# Patient Record
Sex: Male | Born: 2000 | Race: White | Hispanic: No | Marital: Single | Smoking: Never smoker
Health system: Southern US, Community
[De-identification: ages and names within clinical notes are randomized; demographics above are authoritative.]

## PROBLEM LIST (undated history)

## (undated) DIAGNOSIS — R51 Headache: Secondary | ICD-10-CM

## (undated) HISTORY — PX: CIRCUMCISION: SUR203

## (undated) HISTORY — PX: TYMPANOSTOMY TUBE PLACEMENT: SHX32

## (undated) HISTORY — DX: Headache: R51

---
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
## 2020-05-22 ENCOUNTER — Encounter (INDEPENDENT_AMBULATORY_CARE_PROVIDER_SITE_OTHER): Payer: Self-pay

## 2021-02-25 IMAGING — CT CT HEAD W/O CM
3 series · 15 of 47 positions shown, 18 images · non-contrast
Comparison: None.

CLINICAL DATA: Fall

EXAM:
CT HEAD WITHOUT CONTRAST
TECHNIQUE: Contiguous axial images were obtained from the base of the skull
through the vertex without intravenous contrast.

[Series 2: head wo · axial · 0.49mm/px · z∈[+998,+1124]mm · 9 of 31 slices shown, 12 images]
[im 3/31  brain]
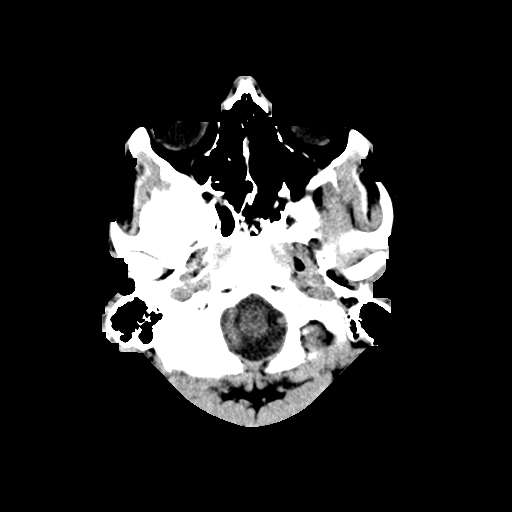
[im 3/31  bone]
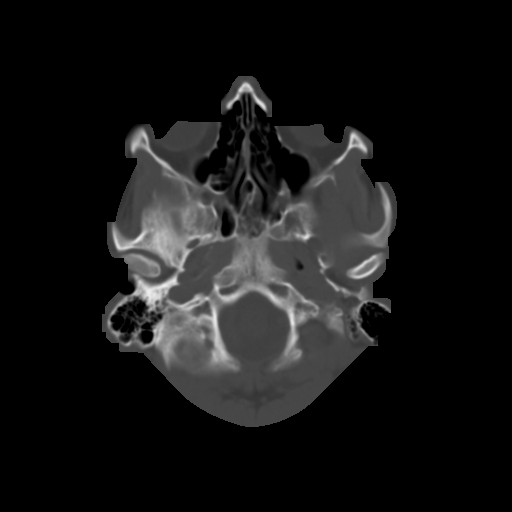
[im 6/31  brain]
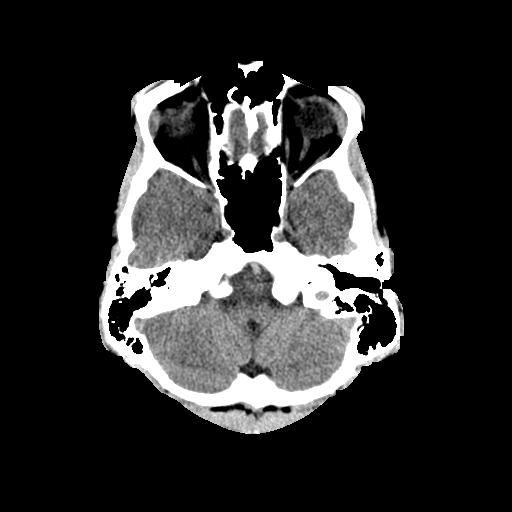
[im 9/31  brain]
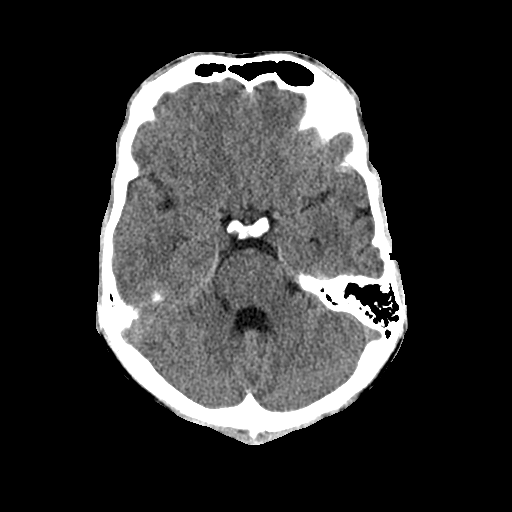
[im 12/31  brain]
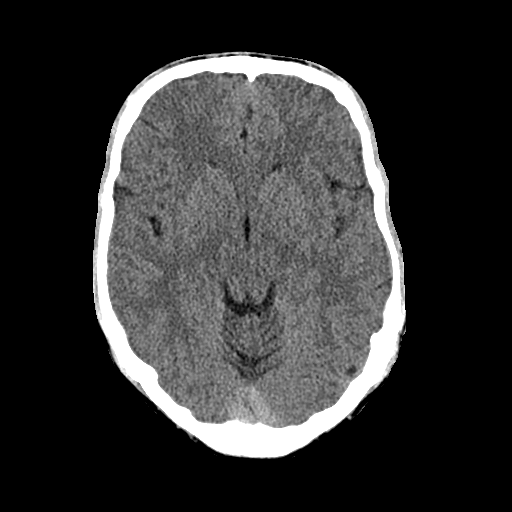
[im 16/31  brain]
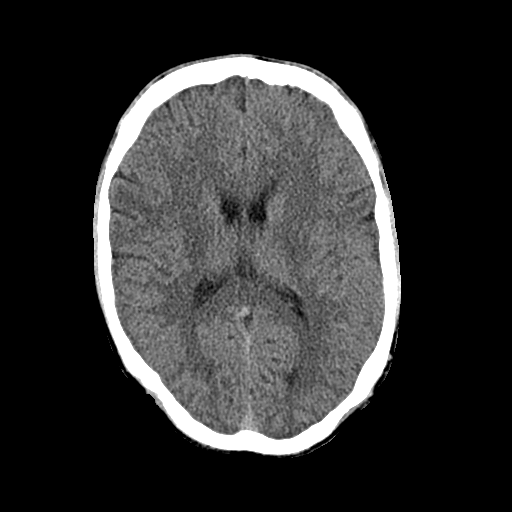
[im 16/31  bone]
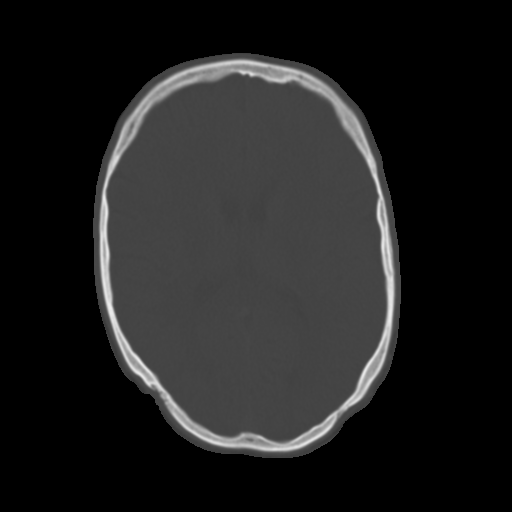
[im 19/31  brain]
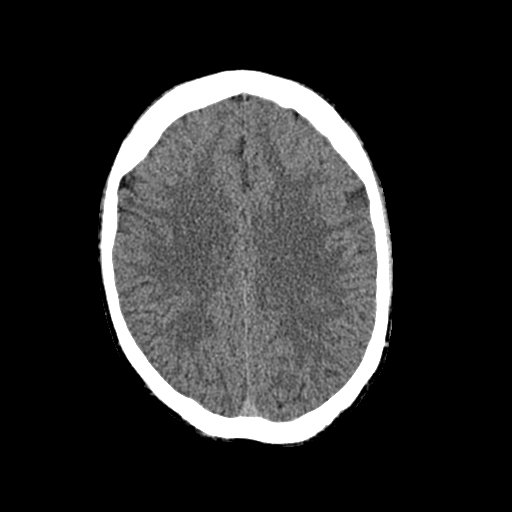
[im 22/31  brain]
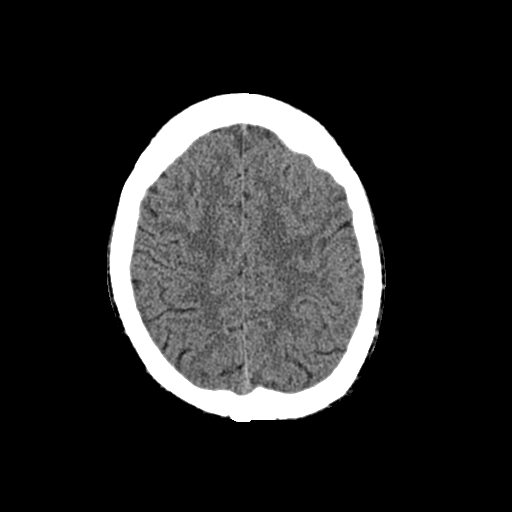
[im 25/31  brain]
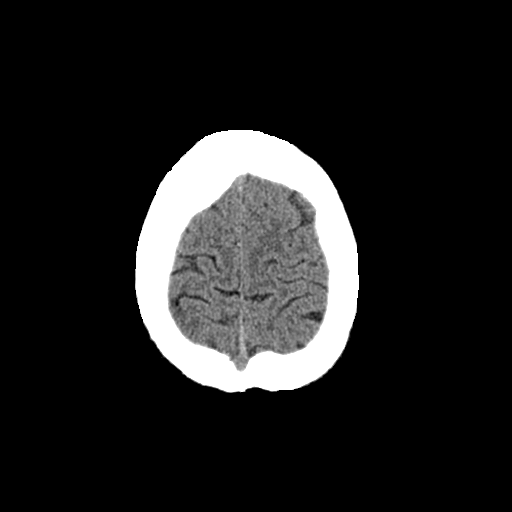
[im 28/31  brain]
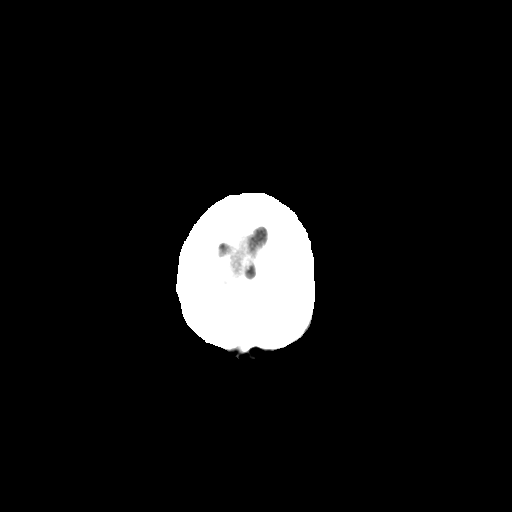
[im 28/31  bone]
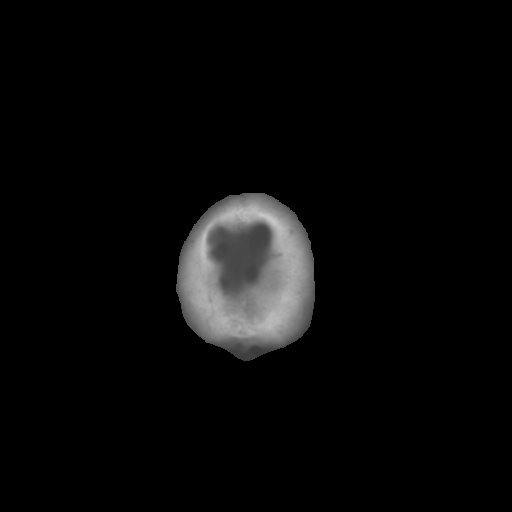

[Series 4: cor soft · coronal · 0.29mm/px · 3 of 73 slices shown]
[im 25/73  brain]
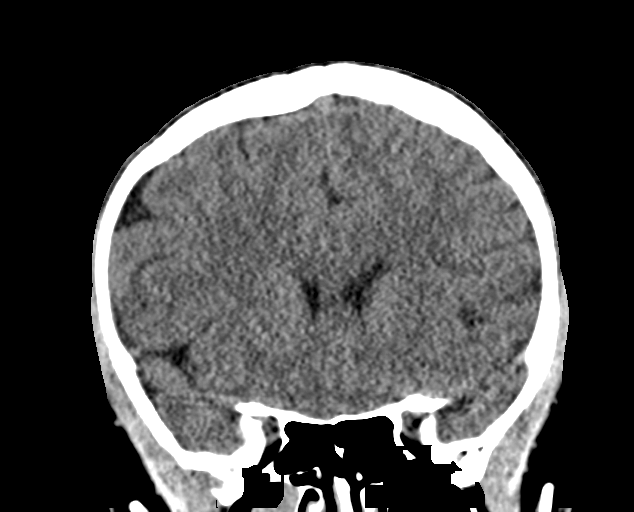
[im 33/73  brain]
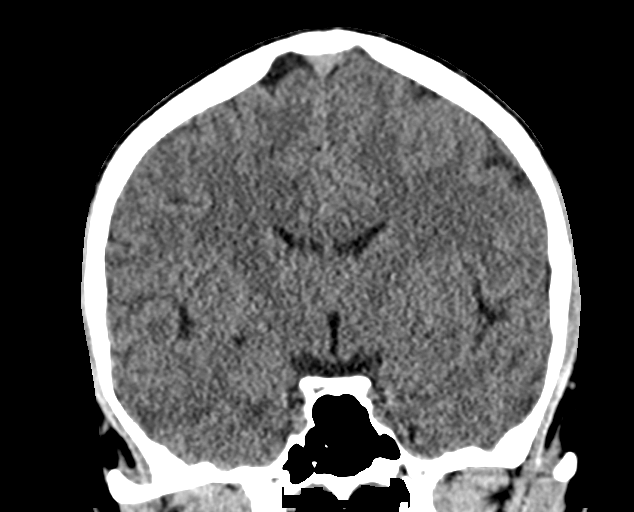
[im 41/73  brain]
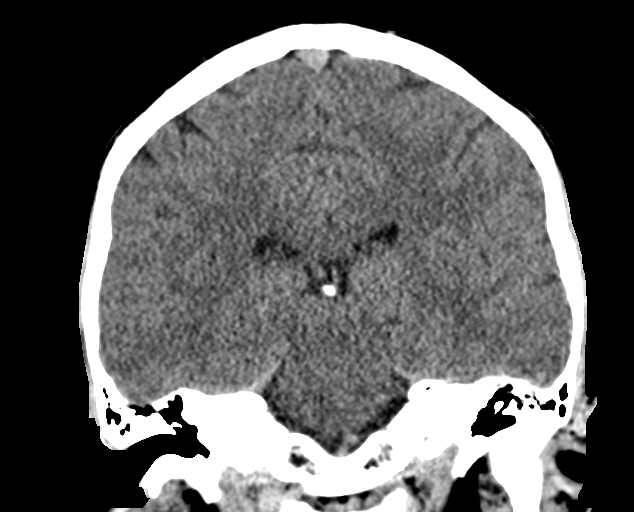

[Series 5: sag soft · sagittal · 0.29mm/px · 3 of 58 slices shown]
[im 20/58  brain]
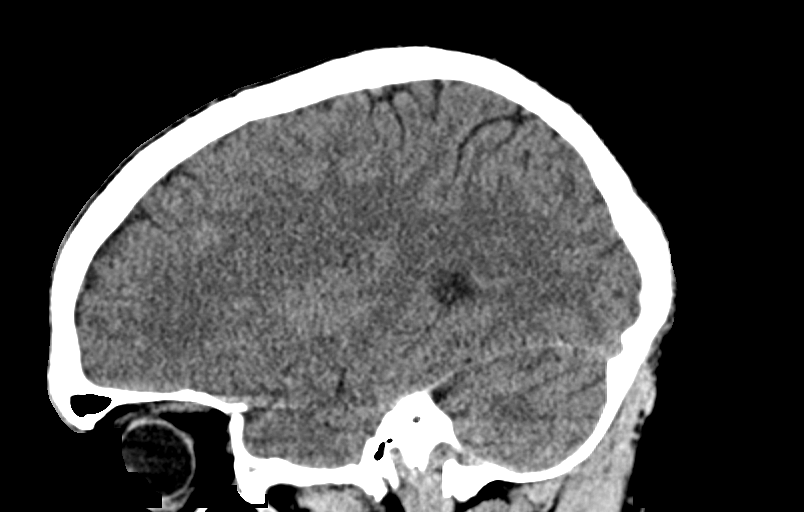
[im 29/58  brain]
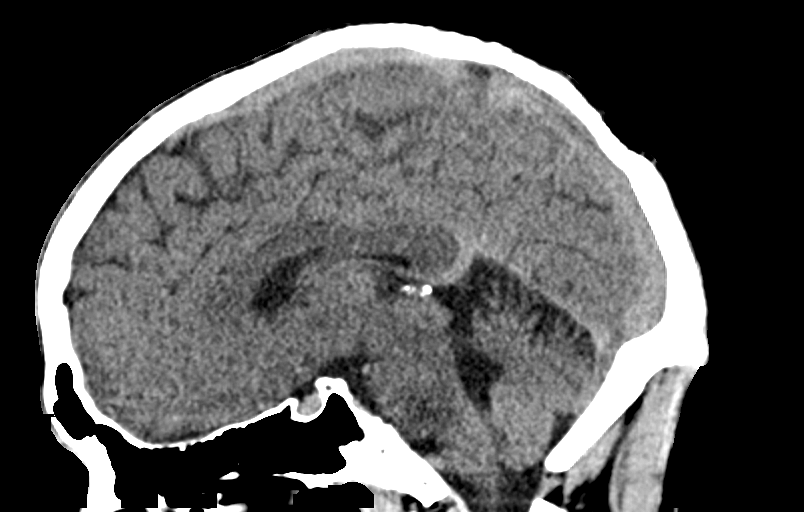
[im 39/58  brain]
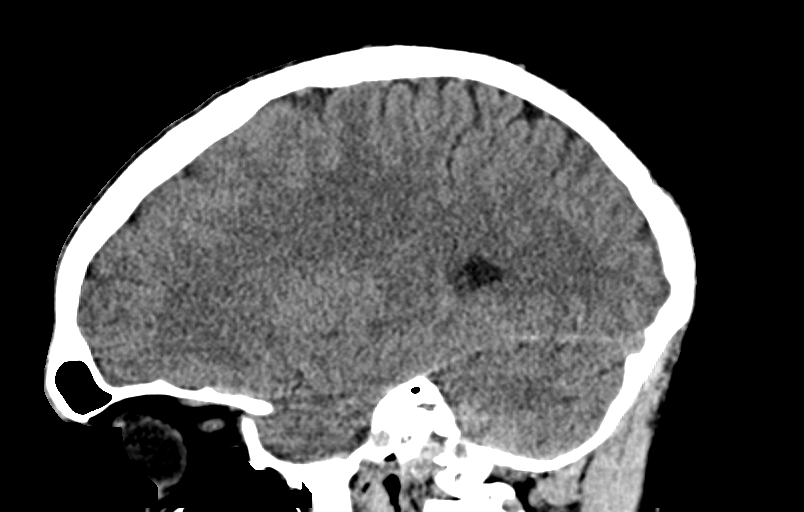

[15 of 47 positions shown; findings below may reference images not displayed]

FINDINGS: Brain: No evidence of acute infarction, hemorrhage, hydrocephalus,
extra-axial collection or mass lesion/mass effect.

Vascular: No hyperdense vessel or unexpected calcification.

Skull: Normal. Negative for fracture or focal lesion.

Sinuses/Orbits: No acute finding. Partially imaged mucoid retention
cyst in the right maxillary sinus.

Other: None.
IMPRESSION: No acute intracranial pathology.

## 2021-08-31 ENCOUNTER — Encounter (HOSPITAL_BASED_OUTPATIENT_CLINIC_OR_DEPARTMENT_OTHER): Payer: Self-pay

## 2021-08-31 ENCOUNTER — Other Ambulatory Visit: Payer: Self-pay

## 2021-08-31 ENCOUNTER — Emergency Department (HOSPITAL_BASED_OUTPATIENT_CLINIC_OR_DEPARTMENT_OTHER)
Admission: EM | Admit: 2021-08-31 | Discharge: 2021-08-31 | Disposition: A | Discharge status: Home / Self Care | Payer: No Typology Code available for payment source | Attending: Emergency Medicine | Admitting: Emergency Medicine

## 2021-08-31 ENCOUNTER — Emergency Department (HOSPITAL_BASED_OUTPATIENT_CLINIC_OR_DEPARTMENT_OTHER): Payer: Managed Care, Other (non HMO)

## 2021-08-31 DIAGNOSIS — R519 Headache, unspecified: Secondary | ICD-10-CM | POA: Diagnosis not present

## 2021-08-31 DIAGNOSIS — W228XXA Striking against or struck by other objects, initial encounter: Secondary | ICD-10-CM | POA: Diagnosis not present

## 2021-08-31 DIAGNOSIS — S0990XA Unspecified injury of head, initial encounter: Secondary | ICD-10-CM | POA: Diagnosis present

## 2021-08-31 NOTE — ED Provider Notes (Signed)
[facility] EMERGENCY DEPARTMENT Provider Note   CSN: [id number] Arrival date & time: 08/31/21  1622     History  Chief Complaint  Patient presents with   Head Injury    Bryan Salazar is a 21 y.o. male.   Head Injury Associated symptoms: headache      Patient presents to ED from urgent care due to head injury.  This morning patient was working on car door and the suspension broke and the door hit the patient in the back of his head.  He did not lose consciousness, he has had a headache since then but is not having any vision changes.  Denies any vomiting, antegrade amnesia, neck pain, use of anticoagulation.  Home Medications Prior to Admission medications   Medication Sig Start Date End Date Taking? Authorizing Provider  naproxen (NAPROSYN) 500 MG tablet Take 500 mg by mouth daily as needed. 1 By mouth PRN for headache. 10/18/12   [provider]  SUMAtriptan (IMITREX) 50 MG tablet Take one tablet with 500 mg of naproxen at onset of migraine, may repeat in 2 hours if headache persists or recurs. 01/25/13   Deetta Perla, MD      Allergies    Penicillins and Penicillins    Review of Systems   Review of Systems  Neurological:  Positive for headaches.    Physical Exam Updated Vital Signs BP 127/74 (BP Location: Right Arm)   Pulse 70   Temp 98.5 F (36.9 C) (Oral)   Resp 17   Ht 6' (1.829 m)   Wt 70.3 kg   SpO2 99%   BMI 21.02 kg/m  Physical Exam Vitals and nursing note reviewed. Exam conducted with a chaperone present.  Constitutional:      Appearance: Normal appearance.  HENT:     Head: Normocephalic and atraumatic.     Comments: No periorbital ecchymosis, battle sign, nasal crepitus, septal hematoma, hemotympanums Eyes:     General: No scleral icterus.       Right eye: No discharge.        Left eye: No discharge.     Extraocular Movements: Extraocular movements intact.     Pupils: Pupils are equal, round, and reactive to light.   Cardiovascular:     Rate and Rhythm: Normal rate and regular rhythm.     Pulses: Normal pulses.     Heart sounds: Normal heart sounds. No murmur heard.    No friction rub. No gallop.  Pulmonary:     Effort: Pulmonary effort is normal. No respiratory distress.     Breath sounds: Normal breath sounds.  Abdominal:     General: Abdomen is flat. Bowel sounds are normal. There is no distension.     Palpations: Abdomen is soft.     Tenderness: There is no abdominal tenderness.  Musculoskeletal:     Cervical back: Normal range of motion. No tenderness.  Skin:    General: Skin is warm and dry.     Coloration: Skin is not jaundiced.  Neurological:     Mental Status: He is alert. Mental status is at baseline.     Coordination: Coordination normal.     Comments: Cranial nerves II through XII grossly intact, grips and bilaterally.  Lower extremity strength equal bilaterally     ED Results / Procedures / Treatments   Labs (all labs ordered are listed, but only abnormal results are displayed) Labs Reviewed - No data to display  EKG None  Radiology CT Head  Wo Contrast  Result Date: 08/31/2021 CLINICAL DATA:  Head injury, car door struck posterior head. Mild headache. EXAM: CT HEAD WITHOUT CONTRAST TECHNIQUE: Contiguous axial images were obtained from the base of the skull through the vertex without intravenous contrast. RADIATION DOSE REDUCTION: This exam was performed according to the departmental dose-optimization program which includes automated exposure control, adjustment of the mA and/or kV according to patient size and/or use of iterative reconstruction technique. COMPARISON:  10/20/2018 FINDINGS: Brain: The brainstem, cerebellum, cerebral peduncles, thalami, basal ganglia, basilar cisterns, and ventricular system appear within normal limits. No intracranial hemorrhage, mass lesion, or acute CVA. Vascular: Unremarkable Skull: Unremarkable Sinuses/Orbits: Bilateral chronic maxillary  sinusitis. Other: No supplemental non-categorized findings. IMPRESSION: 1. No acute intracranial findings. 2. Bilateral chronic maxillary sinusitis. Electronically Signed   By: Gaylyn Rong M.D.   On: 08/31/2021 18:53    Procedures Procedures    Medications Ordered in ED Medications - No data to display  ED Course/ Medical Decision Making/ A&P                           Medical Decision Making Amount and/or Complexity of Data Reviewed Radiology: ordered.   Patient presents due to head injury.  Differential includes but limited to basilar skull fracture, cranial hemorrhage, facial trauma, C-spine injury.  On exam there are no focal deficits.  No cervical spine tenderness, full ROM and based on Nexus CT criteria cervical spine is cleared.  Initially considered not performing the CT of head but based on mechanism and the fact patient was sent here from urgent care we will proceed to rule out any traumatic findings.  Patient exam I do not think there is a basilar skull fracture.  CT head is negative for any acute process.  Reevaluation patient is mentating well.  Does not appear to have a concussion clinically.  Return precaution discussed, patient discharged in stable condition.        Final Clinical Impression(s) / ED Diagnoses Final diagnoses:  Injury of head, initial encounter    Rx / DC Orders ED Discharge Orders     None         Theron Arista, PA-C 08/31/21 2244    Rolan Bucco, MD 08/31/21 2259

## 2021-08-31 NOTE — ED Triage Notes (Signed)
Patient here POV from [facility].  Endorses Head Injury this AM when a Car Door Suspension Broke and the Door hit the Patient on the Posterior Head. Occurred at approximately 0815.   No LOC. No Anticoagulants. Mild Headache. No Vision Changes. Some Dizziness. Seen at [facility] but Sent for Evaluation.  NAD Noted during Triage. A&Ox4. GCS 15. Ambulatory.

## 2021-08-31 NOTE — ED Notes (Signed)
Pt was behind the  service truck and the back rool up door came down  hit the back of his neck and base of his skull , had some dizziness at first ,, denies nausea or blurred vision  or LOC athat may be better felt pressure behind his eyes

## 2021-08-31 NOTE — Discharge Instructions (Addendum)
You are seen today for head injury.  The CAT scan of your head is negative for any traumatic findings.  You can take Tylenol and Motrin for your headache, follow-up with your PCP if you have any persistent symptoms later this week.
